# Patient Record
Sex: Male | Born: 1965 | Race: White | Hispanic: No | Marital: Married | Smoking: Never smoker
Health system: Southern US, Community
[De-identification: ages and names within clinical notes are randomized; demographics above are authoritative.]

## PROBLEM LIST (undated history)

## (undated) DIAGNOSIS — I10 Essential (primary) hypertension: Secondary | ICD-10-CM

## (undated) DIAGNOSIS — E039 Hypothyroidism, unspecified: Secondary | ICD-10-CM

## (undated) DIAGNOSIS — J45909 Unspecified asthma, uncomplicated: Secondary | ICD-10-CM

## (undated) HISTORY — PX: CERVICAL DISC SURGERY: SHX588

## (undated) HISTORY — PX: COLONOSCOPY WITH PROPOFOL: SHX5780

---

## 2008-06-24 ENCOUNTER — Ambulatory Visit: Payer: Self-pay | Admitting: Unknown Physician Specialty

## 2012-09-22 DIAGNOSIS — M47812 Spondylosis without myelopathy or radiculopathy, cervical region: Secondary | ICD-10-CM | POA: Insufficient documentation

## 2012-09-22 DIAGNOSIS — M25519 Pain in unspecified shoulder: Secondary | ICD-10-CM | POA: Insufficient documentation

## 2014-01-25 DIAGNOSIS — R7989 Other specified abnormal findings of blood chemistry: Secondary | ICD-10-CM | POA: Insufficient documentation

## 2015-02-17 DIAGNOSIS — I10 Essential (primary) hypertension: Secondary | ICD-10-CM | POA: Insufficient documentation

## 2015-02-17 DIAGNOSIS — J302 Other seasonal allergic rhinitis: Secondary | ICD-10-CM | POA: Insufficient documentation

## 2015-10-05 ENCOUNTER — Ambulatory Visit (INDEPENDENT_AMBULATORY_CARE_PROVIDER_SITE_OTHER): Payer: Federal, State, Local not specified - PPO | Admitting: Urology

## 2015-10-05 ENCOUNTER — Encounter: Payer: Self-pay | Admitting: Urology

## 2015-10-05 VITALS — BP 109/71 | HR 75 | Ht 71.0 in | Wt 219.0 lb

## 2015-10-05 DIAGNOSIS — M549 Dorsalgia, unspecified: Secondary | ICD-10-CM

## 2015-10-05 DIAGNOSIS — N5201 Erectile dysfunction due to arterial insufficiency: Secondary | ICD-10-CM

## 2015-10-05 NOTE — Progress Notes (Signed)
10/05/2015 2:21 PM   Duane Soto Apr 02, 1965 [id number]  Referring provider: No referring provider defined for this encounter.  Chief Complaint  Patient presents with  . New Patient (Initial Visit)    ED    HPI: For the last month the patient has trouble sustaining an erection. He loses it before orgasm. Cialis helps minimally tender none. He's had some vague pressure in his lower back and left groin. He has no precipitating events. He's been on testosterone injection every 2 weeks for 3-4 years.  He has a multiple stone former and recently passed 3 stones. He has hypothyroidism.  He voids with a good flow and sometimes gets up once at night. He's not had previous GU or pelvic surgery as a risk factor for rectal dysfunction.    Modifying factors: There are no other modifying factors  Associated signs and symptoms: There are no other associated signs and symptoms Aggravating and relieving factors: There are no other aggravating or relieving factors Severity: Moderate Duration: Persistent   PMH: No past medical history on file.  Surgical History: No past surgical history on file.  Home Medications:    Medication List       Accurate as of 10/05/15  2:21 PM. Always use your most recent med list.          levothyroxine 50 MCG tablet Commonly known as:  SYNTHROID, LEVOTHROID Take 50 mcg by mouth daily before breakfast.   loratadine 10 MG tablet Commonly known as:  CLARITIN Take 10 mg by mouth daily.   losartan-hydrochlorothiazide 100-25 MG tablet Commonly known as:  HYZAAR   Testosterone Cypionate 200 MG/ML Kit Inject into the muscle.       Allergies: No Known Allergies  Family History: No family history on file.  Social History:  reports that he has never smoked. He has never used smokeless tobacco. He reports that he drinks alcohol. He reports that he does not use drugs.  ROS:                                        Physical  Exam: BP 109/71   Pulse 75   Ht _0  (1.803 m)   Wt 219 lb (99.3 kg)   BMI 30.54 kg/m   Constitutional:  Alert and oriented, No acute distress. HEENT: [place] AT, moist mucus membranes.  Trachea midline, no masses. Cardiovascular: No clubbing, cyanosis, or edema. Respiratory: Normal respiratory effort, no increased work of breathing. GI: Abdomen is soft, nontender, nondistended, no abdominal masses GU: No CVA tenderness. 40 g benign prostate Skin: No rashes, bruises or suspicious lesions. Lymph: No cervical or inguinal adenopathy. Neurologic: Grossly intact, no focal deficits, moving all 4 extremities. Psychiatric: Normal mood and affect.  Laboratory Data: No results found for: WBC, HGB, HCT, MCV, PLT  No results found for: CREATININE  No results found for: PSA  No results found for: TESTOSTERONE  No results found for: HGBA1C  Urinalysis No results found for: COLORURINE, APPEARANCEUR, LABSPEC, PHURINE, GLUCOSEU, HGBUR, BILIRUBINUR, KETONESUR, PROTEINUR, UROBILINOGEN, NITRITE, LEUKOCYTESUR  Pertinent Imaging: None  Assessment & Plan:  The patient has erectile dysfunction. He has a risk factor with hypogonadism treated with testosterone. Pathophysiology discussed. Role of other medication discussed. I talked about other options but certainly he should not have a penile implant this stage. He still able to have good days and bad days with his erections. His  testosterone level is over 800. I gave him some Viagra samples. He will call if he needs a prescription and generic medicine can always be prescribed. He will make another appointment with another provider. The office if he is searching for another treatment but he did not seem very interested in injection therapy  There are no diagnoses linked to this encounter.  No Follow-up on file.  Reece Packer, MD  [facility] [address], [place], [place] [address] [phone]

## 2015-10-05 NOTE — Addendum Note (Signed)
Addended by: Lonna Cobb on: 10/05/2015 04:37 PM   Modules accepted: Orders

## 2015-10-07 LAB — CULTURE, URINE COMPREHENSIVE

## 2017-02-12 ENCOUNTER — Encounter: Payer: Self-pay | Admitting: Emergency Medicine

## 2017-02-12 ENCOUNTER — Emergency Department: Payer: Worker's Compensation

## 2017-02-12 ENCOUNTER — Other Ambulatory Visit: Payer: Self-pay

## 2017-02-12 ENCOUNTER — Emergency Department
Admission: EM | Admit: 2017-02-12 | Discharge: 2017-02-12 | Disposition: A | Discharge status: Home / Self Care | Payer: Worker's Compensation | Attending: Emergency Medicine | Admitting: Emergency Medicine

## 2017-02-12 DIAGNOSIS — Y929 Unspecified place or not applicable: Secondary | ICD-10-CM | POA: Diagnosis not present

## 2017-02-12 DIAGNOSIS — Y939 Activity, unspecified: Secondary | ICD-10-CM | POA: Insufficient documentation

## 2017-02-12 DIAGNOSIS — S82891A Other fracture of right lower leg, initial encounter for closed fracture: Secondary | ICD-10-CM

## 2017-02-12 DIAGNOSIS — Z79899 Other long term (current) drug therapy: Secondary | ICD-10-CM | POA: Diagnosis not present

## 2017-02-12 DIAGNOSIS — S82831A Other fracture of upper and lower end of right fibula, initial encounter for closed fracture: Secondary | ICD-10-CM | POA: Diagnosis not present

## 2017-02-12 DIAGNOSIS — Y990 Civilian activity done for income or pay: Secondary | ICD-10-CM | POA: Insufficient documentation

## 2017-02-12 DIAGNOSIS — S93431A Sprain of tibiofibular ligament of right ankle, initial encounter: Secondary | ICD-10-CM | POA: Diagnosis not present

## 2017-02-12 DIAGNOSIS — S8991XA Unspecified injury of right lower leg, initial encounter: Secondary | ICD-10-CM | POA: Diagnosis present

## 2017-02-12 DIAGNOSIS — W000XXA Fall on same level due to ice and snow, initial encounter: Secondary | ICD-10-CM | POA: Insufficient documentation

## 2017-02-12 DIAGNOSIS — S82401A Unspecified fracture of shaft of right fibula, initial encounter for closed fracture: Secondary | ICD-10-CM | POA: Diagnosis not present

## 2017-02-12 DIAGNOSIS — I10 Essential (primary) hypertension: Secondary | ICD-10-CM | POA: Insufficient documentation

## 2017-02-12 HISTORY — DX: Essential (primary) hypertension: I10

## 2017-02-12 MED ORDER — SODIUM CHLORIDE 0.9 % IV BOLUS (SEPSIS)
1000.0000 mL | Freq: Once | INTRAVENOUS | Status: AC
  Administered 2017-02-12: 1000 mL via INTRAVENOUS

## 2017-02-12 MED ORDER — OXYCODONE HCL 5 MG PO TABS
5.0000 mg | ORAL_TABLET | Freq: Once | ORAL | Status: AC
  Administered 2017-02-12: 5 mg via ORAL
  Filled 2017-02-12: qty 1

## 2017-02-12 MED ORDER — FENTANYL CITRATE (PF) 100 MCG/2ML IJ SOLN
50.0000 ug | INTRAMUSCULAR | Status: AC | PRN
  Administered 2017-02-12 (×2): 50 ug via INTRAVENOUS
  Filled 2017-02-12 (×2): qty 2

## 2017-02-12 MED ORDER — IBUPROFEN 800 MG PO TABS
800.0000 mg | ORAL_TABLET | Freq: Once | ORAL | Status: AC
  Administered 2017-02-12: 800 mg via ORAL
  Filled 2017-02-12: qty 1

## 2017-02-12 MED ORDER — ONDANSETRON 4 MG PO TBDP: 4.0000 mg | ORAL_TABLET | Freq: Three times a day (TID) | ORAL | 0 refills | Status: AC | PRN

## 2017-02-12 MED ORDER — OXYCODONE-ACETAMINOPHEN 5-325 MG PO TABS: 1.0000 | ORAL_TABLET | ORAL | 0 refills | Status: AC | PRN

## 2017-02-12 NOTE — ED Notes (Signed)
See triage note  States pain is mainly to right ankle and moving up to right lower leg  Positive pulses

## 2017-02-12 NOTE — ED Provider Notes (Signed)
[facility] Emergency Department Provider Note ____________________________________________  Time seen: Approximately 9:19 AM  I have reviewed the triage vital signs and the nursing notes.   HISTORY  Chief Complaint Fall    HPI Duane Soto is a 51 y.o. male who presents to the emergency department for treatment and evaluation of right ankle pain and swelling after sustaining a mechanical, non-syncopal fall prior to arrival. 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 He states that he could not regain his balance and fell. He heard something crack in his right leg or ankle and was immediately unable to bear weight. He now has significant pain and swelling over the right ankle and pain in the lower leg.  Past Medical History:  Diagnosis Date  . Hypertension     Patient Active Problem List   Diagnosis Date Noted  . Allergic rhinitis, seasonal 02/17/2015  . Benign essential hypertension 02/17/2015  . Low testosterone 01/25/2014  . Cervical spondylosis 09/22/2012  . Shoulder joint pain 09/22/2012    History reviewed. No pertinent surgical history.  Prior to Admission medications   Medication Sig Start Date End Date Taking? Authorizing Provider  levothyroxine (SYNTHROID, LEVOTHROID) 50 MCG tablet Take 50 mcg by mouth daily before breakfast.    [provider]  loratadine (CLARITIN) 10 MG tablet Take 10 mg by mouth daily.    [provider]  losartan-hydrochlorothiazide Konrad Penta) 100-25 MG tablet  09/13/15   [provider]  ondansetron (ZOFRAN-ODT) 4 MG disintegrating tablet Take 1 tablet (4 mg total) by mouth every 8 (eight) hours as needed for nausea or vomiting. 02/12/17   Mireyah Chervenak B, FNP  oxyCODONE-acetaminophen (ROXICET) 5-325 MG tablet Take 1-2 tablets by mouth every 4 (four) hours as needed for severe pain. 02/12/17   Faryn Sieg, Johnette Abraham B, FNP  Testosterone Cypionate 200 MG/ML KIT Inject  into the muscle.    [provider]    Allergies Patient has no known allergies.  No family history on file.  Social History Social History   Tobacco Use  . Smoking status: Never Smoker  . Smokeless tobacco: Never Used  Substance Use Topics  . Alcohol use: Yes  . Drug use: No    Review of Systems Constitutional: Negative for recent illness Cardiovascular: Negative for chest pain Respiratory: Negative for shortness of breath Musculoskeletal: Positive for right lower extremity pain. Skin: Negative for open wounds Neurological: Negative for loss of consciousness  ____________________________________________   PHYSICAL EXAM:  VITAL SIGNS: ED Triage Vitals  Enc Vitals Group     BP 02/12/17 0907 119/71     Pulse Rate 02/12/17 0907 71     Resp 02/12/17 0907 18     Temp 02/12/17 0907 97.9 F (36.6 C)     Temp Source 02/12/17 0907 Oral     SpO2 02/12/17 0907 96 %     Weight 02/12/17 0902 225 lb (102.1 kg)     Height 02/12/17 0902 5' 11"  (1.803 m)     Head Circumference --      Peak Flow --      Pain Score --      Pain Loc --      Pain Edu? --      Excl. in [place]? --     Constitutional: Alert and oriented. Well appearing and in no acute distress. Eyes: Conjunctivae are clear without discharge or drainage Head: Atraumatic Neck: Supple, Nexus criteria negative. Respiratory: Respirations even and unlabored.  Musculoskeletal: Focal tenderness over the lower right fibula and diffuse tenderness with deformity of the ankle.  No tenderness elicited with palpation over the foot.  Full range of motion of the toes.  Full range of motion at the knee.  No bony tenderness over the proximal tibia or fibula.  No focal midline tenderness over the length of the spine.  Neurologic: Awake, alert, oriented x4. Skin: Intact Psychiatric: Affect and behavior are appropriate.  ____________________________________________   LABS (all labs ordered are listed, but only abnormal  results are displayed)  Labs Reviewed - No data to display ____________________________________________  RADIOLOGY  Right ankle image shows a comminuted, distracted fracture of the junction of the middle and distal thirds of the right fibula.  Also, a mildly displaced posterior malleolar fracture with disruption of the ankle joint mortise. I, Sherrie George, personally viewed and evaluated these images (plain radiographs) as part of my medical decision making, as well as reviewing the written report by the radiologist.  CT of the right ankle: IMPRESSION:  1. Comminuted fracture of the distal fibular diaphysis with minimal  displacement.  2. Comminuted posterior tibial malleolar fracture with 5 mm of  distraction and involvement of the articular surface.  3. Widening of the distal tibiofibular joint consistent with  syndesmotic injury.  ___________________________________________   PROCEDURES  .Splint Application Date/Time: 03/49/1791 1:08 PM Performed by: Victorino Dike, FNP Authorized by: Victorino Dike, FNP   Consent:    Consent obtained:  Verbal Pre-procedure details:    Sensation:  Normal Procedure details:    Laterality:  Right   Cast type: long leg.   Splint type:  Long leg   Supplies:  Ortho-Glass Post-procedure details:    Pain:  Unchanged   Sensation:  Normal   Patient tolerance of procedure:  Tolerated well, no immediate complications  ____________________________________________   INITIAL IMPRESSION / ASSESSMENT AND PLAN / ED COURSE  Duane Soto is a 51 y.o. male who presents to the emergency department for treatment and evaluation after sustaining a mechanical, non-syncopal fall while at work.  Obvious deformity of the right ankle.  Awaiting x-ray results.  ----------------------------------------- 10:39 AM on 02/12/2017 ----------------------------------------- X-ray findings discussed with Dr. Rudene Christians who requests CT only of the ankle to further  evaluate the fractures.  Patient and family were updated and agreed to testing and treatment.  ----------------------------------------- 1:10 PM on 02/12/2017 ----------------------------------------- Supervisor who was in the room was asked if he would need any type of testing today for the workers [employer].  She stated that no drug screen or alcohol testing is required.  Patient discharged home after CT was complete.  Appointment for tomorrow at 130 was scheduled for the patient and he is aware and agreeable to the date and time.  He verbalizes understanding that he will need to go to the office tomorrow.  Home care was discussed.  He was given return precautions for the emergency department.  He will be given a prescription for Roxicet.    Medications  ibuprofen (ADVIL,MOTRIN) tablet 800 mg (800 mg Oral Given 02/12/17 0916)  fentaNYL (SUBLIMAZE) injection 50 mcg (50 mcg Intravenous Given 02/12/17 1154)  sodium chloride 0.9 % bolus 1,000 mL (0 mLs Intravenous Stopped 02/12/17 1258)  oxyCODONE (Oxy IR/ROXICODONE) immediate release tablet 5 mg (5 mg Oral Given 02/12/17 1243)    Pertinent labs & imaging results that were available during my care of the patient were reviewed by me and considered in my medical decision making (see chart  for details).  _________________________________________   FINAL CLINICAL IMPRESSION(S) / ED DIAGNOSES  Final diagnoses:  Closed fracture of distal end of right fibula, unspecified fracture morphology, initial encounter  Closed fracture of shaft of right fibula, unspecified fracture morphology, initial encounter  Syndesmotic disruption of ankle, right, initial encounter  Fracture of malleolus of right ankle, closed, initial encounter    ED Discharge Orders        Ordered    oxyCODONE-acetaminophen (ROXICET) 5-325 MG tablet  Every 4 hours PRN     02/12/17 1218    ondansetron (ZOFRAN-ODT) 4 MG disintegrating tablet  Every 8 hours PRN     02/12/17  1218       If controlled substance prescribed during this visit, 12 month history viewed on the [place] prior to issuing an initial prescription for Schedule II or III opiod.    Victorino Dike, FNP 02/12/17 1313    Lisa Roca, MD 02/12/17 [phone]

## 2017-02-12 NOTE — Discharge Instructions (Signed)
Please go to Dr. Neomia GlassMenz's office tomorrow at 1:30.  Come back to the ER today if your pain becomes uncontrolled with the pain medication or for other symptoms of concern.

## 2017-02-12 NOTE — ED Triage Notes (Signed)
Brought in via ems s/p fall  Injury to right ankle  Positive swelling   Questionable deformity

## 2017-02-14 ENCOUNTER — Ambulatory Visit

## 2017-02-14 ENCOUNTER — Encounter: Payer: Self-pay | Admitting: *Deleted

## 2017-02-14 ENCOUNTER — Ambulatory Visit: Admitting: Anesthesiology

## 2017-02-14 ENCOUNTER — Other Ambulatory Visit: Payer: Self-pay

## 2017-02-14 ENCOUNTER — Encounter
Admission: RE | Disposition: A | Discharge status: Home / Self Care | Payer: Self-pay | Source: Ambulatory Visit | Attending: Orthopedic Surgery

## 2017-02-14 ENCOUNTER — Ambulatory Visit
Admission: RE | Admit: 2017-02-14 | Discharge: 2017-02-14 | Disposition: A | Discharge status: Home / Self Care | Source: Ambulatory Visit | Attending: Orthopedic Surgery | Admitting: Orthopedic Surgery

## 2017-02-14 DIAGNOSIS — Z79899 Other long term (current) drug therapy: Secondary | ICD-10-CM | POA: Diagnosis not present

## 2017-02-14 DIAGNOSIS — E291 Testicular hypofunction: Secondary | ICD-10-CM | POA: Insufficient documentation

## 2017-02-14 DIAGNOSIS — I10 Essential (primary) hypertension: Secondary | ICD-10-CM | POA: Diagnosis not present

## 2017-02-14 DIAGNOSIS — X58XXXA Exposure to other specified factors, initial encounter: Secondary | ICD-10-CM | POA: Insufficient documentation

## 2017-02-14 DIAGNOSIS — Z419 Encounter for procedure for purposes other than remedying health state, unspecified: Secondary | ICD-10-CM

## 2017-02-14 DIAGNOSIS — S82454A Nondisplaced comminuted fracture of shaft of right fibula, initial encounter for closed fracture: Secondary | ICD-10-CM | POA: Insufficient documentation

## 2017-02-14 HISTORY — PX: ORIF ANKLE FRACTURE: SHX5408

## 2017-02-14 SURGERY — OPEN REDUCTION INTERNAL FIXATION (ORIF) ANKLE FRACTURE
Anesthesia: General | Site: Ankle | Laterality: Right | Wound class: Clean

## 2017-02-14 MED ORDER — FENTANYL CITRATE (PF) 100 MCG/2ML IJ SOLN
INTRAMUSCULAR | Status: AC
  Filled 2017-02-14: qty 2

## 2017-02-14 MED ORDER — SUGAMMADEX SODIUM 200 MG/2ML IV SOLN
INTRAVENOUS | Status: DC | PRN
  Administered 2017-02-14: 204.2 mg via INTRAVENOUS

## 2017-02-14 MED ORDER — OXYCODONE HCL 5 MG/5ML PO SOLN: 5.0000 mg | Freq: Once | ORAL | Status: AC | PRN

## 2017-02-14 MED ORDER — DEXAMETHASONE SODIUM PHOSPHATE 10 MG/ML IJ SOLN
INTRAMUSCULAR | Status: DC | PRN
  Administered 2017-02-14: 10 mg via INTRAVENOUS

## 2017-02-14 MED ORDER — PROPOFOL 10 MG/ML IV BOLUS
INTRAVENOUS | Status: AC
  Filled 2017-02-14: qty 20

## 2017-02-14 MED ORDER — SUCCINYLCHOLINE CHLORIDE 20 MG/ML IJ SOLN
INTRAMUSCULAR | Status: AC
  Filled 2017-02-14: qty 1

## 2017-02-14 MED ORDER — ROCURONIUM BROMIDE 50 MG/5ML IV SOLN
INTRAVENOUS | Status: AC
Start: 2017-02-14 — End: ?
  Filled 2017-02-14: qty 1

## 2017-02-14 MED ORDER — OXYCODONE-ACETAMINOPHEN 5-325 MG PO TABS: 1.0000 | ORAL_TABLET | Freq: Four times a day (QID) | ORAL | 0 refills | Status: AC | PRN

## 2017-02-14 MED ORDER — LIDOCAINE HCL (CARDIAC) 20 MG/ML IV SOLN
INTRAVENOUS | Status: DC | PRN
  Administered 2017-02-14: 100 mg via INTRAVENOUS

## 2017-02-14 MED ORDER — PROPOFOL 10 MG/ML IV BOLUS
INTRAVENOUS | Status: DC | PRN
  Administered 2017-02-14: 200 mg via INTRAVENOUS

## 2017-02-14 MED ORDER — LIDOCAINE HCL (PF) 2 % IJ SOLN
INTRAMUSCULAR | Status: AC
  Filled 2017-02-14: qty 10

## 2017-02-14 MED ORDER — LACTATED RINGERS IV SOLN
INTRAVENOUS | Status: DC
  Administered 2017-02-14 (×2): via INTRAVENOUS

## 2017-02-14 MED ORDER — FAMOTIDINE 20 MG PO TABS
ORAL_TABLET | ORAL | Status: AC
  Administered 2017-02-14: 20 mg via ORAL
  Filled 2017-02-14: qty 1

## 2017-02-14 MED ORDER — ROCURONIUM BROMIDE 100 MG/10ML IV SOLN
INTRAVENOUS | Status: DC | PRN
  Administered 2017-02-14 (×2): 10 mg via INTRAVENOUS
  Administered 2017-02-14: 40 mg via INTRAVENOUS

## 2017-02-14 MED ORDER — FENTANYL CITRATE (PF) 100 MCG/2ML IJ SOLN
25.0000 ug | INTRAMUSCULAR | Status: AC | PRN
  Administered 2017-02-14 (×2): 25 ug via INTRAVENOUS

## 2017-02-14 MED ORDER — MIDAZOLAM HCL 2 MG/2ML IJ SOLN
INTRAMUSCULAR | Status: DC | PRN
Start: 2017-02-14 — End: 2017-02-14
  Administered 2017-02-14: 2 mg via INTRAVENOUS

## 2017-02-14 MED ORDER — DEXAMETHASONE SODIUM PHOSPHATE 10 MG/ML IJ SOLN
INTRAMUSCULAR | Status: AC
  Filled 2017-02-14: qty 1

## 2017-02-14 MED ORDER — KETOROLAC TROMETHAMINE 30 MG/ML IJ SOLN
INTRAMUSCULAR | Status: AC
  Filled 2017-02-14: qty 1

## 2017-02-14 MED ORDER — FENTANYL CITRATE (PF) 100 MCG/2ML IJ SOLN
INTRAMUSCULAR | Status: AC
  Administered 2017-02-14: 25 ug via INTRAVENOUS
  Filled 2017-02-14: qty 2

## 2017-02-14 MED ORDER — MIDAZOLAM HCL 2 MG/2ML IJ SOLN
INTRAMUSCULAR | Status: AC
Start: 2017-02-14 — End: ?
  Filled 2017-02-14: qty 2

## 2017-02-14 MED ORDER — FENTANYL CITRATE (PF) 100 MCG/2ML IJ SOLN
25.0000 ug | INTRAMUSCULAR | Status: AC | PRN
  Administered 2017-02-14 (×6): 25 ug via INTRAVENOUS

## 2017-02-14 MED ORDER — ONDANSETRON HCL 4 MG/2ML IJ SOLN
INTRAMUSCULAR | Status: AC
  Filled 2017-02-14: qty 2

## 2017-02-14 MED ORDER — OXYCODONE HCL 5 MG PO TABS
5.0000 mg | ORAL_TABLET | Freq: Once | ORAL | Status: AC | PRN
  Administered 2017-02-14: 5 mg via ORAL

## 2017-02-14 MED ORDER — FENTANYL CITRATE (PF) 100 MCG/2ML IJ SOLN
INTRAMUSCULAR | Status: DC | PRN
  Administered 2017-02-14 (×2): 50 ug via INTRAVENOUS
  Administered 2017-02-14: 100 ug via INTRAVENOUS

## 2017-02-14 MED ORDER — CEFAZOLIN SODIUM-DEXTROSE 2-4 GM/100ML-% IV SOLN
2.0000 g | Freq: Once | INTRAVENOUS | Status: AC
  Administered 2017-02-14: 2 g via INTRAVENOUS

## 2017-02-14 MED ORDER — KETOROLAC TROMETHAMINE 30 MG/ML IJ SOLN
INTRAMUSCULAR | Status: DC | PRN
  Administered 2017-02-14: 30 mg via INTRAVENOUS

## 2017-02-14 MED ORDER — SUGAMMADEX SODIUM 200 MG/2ML IV SOLN
INTRAVENOUS | Status: AC
Start: 2017-02-14 — End: ?
  Filled 2017-02-14: qty 2

## 2017-02-14 MED ORDER — CEFAZOLIN SODIUM-DEXTROSE 2-4 GM/100ML-% IV SOLN
INTRAVENOUS | Status: AC
  Filled 2017-02-14: qty 100

## 2017-02-14 MED ORDER — ACETAMINOPHEN 10 MG/ML IV SOLN
INTRAVENOUS | Status: DC | PRN
  Administered 2017-02-14: 1000 mg via INTRAVENOUS

## 2017-02-14 MED ORDER — ACETAMINOPHEN 10 MG/ML IV SOLN
INTRAVENOUS | Status: AC
Start: 2017-02-14 — End: ?
  Filled 2017-02-14: qty 100

## 2017-02-14 MED ORDER — FAMOTIDINE 20 MG PO TABS
20.0000 mg | ORAL_TABLET | Freq: Once | ORAL | Status: AC
  Administered 2017-02-14: 20 mg via ORAL

## 2017-02-14 MED ORDER — SUCCINYLCHOLINE CHLORIDE 20 MG/ML IJ SOLN
INTRAMUSCULAR | Status: DC | PRN
  Administered 2017-02-14: 120 mg via INTRAVENOUS

## 2017-02-14 MED ORDER — ONDANSETRON HCL 4 MG/2ML IJ SOLN
INTRAMUSCULAR | Status: DC | PRN
  Administered 2017-02-14: 4 mg via INTRAVENOUS

## 2017-02-14 MED ORDER — OXYCODONE HCL 5 MG PO TABS
ORAL_TABLET | ORAL | Status: AC
  Administered 2017-02-14: 5 mg via ORAL
  Filled 2017-02-14: qty 1

## 2017-02-14 MED ORDER — NEOMYCIN-POLYMYXIN B GU 40-200000 IR SOLN
Status: DC | PRN
  Administered 2017-02-14: 4 mL

## 2017-02-14 SURGICAL SUPPLY — 46 items
BANDAGE ACE 4X5 VEL STRL LF (GAUZE/BANDAGES/DRESSINGS) ×6 IMPLANT
CANISTER SUCT 1200ML W/VALVE (MISCELLANEOUS) ×3 IMPLANT
CHLORAPREP W/TINT 26ML (MISCELLANEOUS) ×3 IMPLANT
CUFF TOURN 24 STER (MISCELLANEOUS) IMPLANT
CUFF TOURN 30 STER DUAL PORT (MISCELLANEOUS) IMPLANT
DRAPE FLUOR MINI C-ARM 54X84 (DRAPES) ×3 IMPLANT
DRAPE INCISE IOBAN 66X45 STRL (DRAPES) ×3 IMPLANT
DRAPE U-SHAPE 47X51 STRL (DRAPES) ×3 IMPLANT
DRSG EMULSION OIL 3X8 NADH (GAUZE/BANDAGES/DRESSINGS) ×3 IMPLANT
ELECT CAUTERY BLADE 6.4 (BLADE) ×3 IMPLANT
ELECT REM PT RETURN 9FT ADLT (ELECTROSURGICAL) ×3
ELECTRODE REM PT RTRN 9FT ADLT (ELECTROSURGICAL) ×1 IMPLANT
GAUZE PETRO XEROFOAM 1X8 (MISCELLANEOUS) ×3 IMPLANT
GAUZE SPONGE 4X4 12PLY STRL (GAUZE/BANDAGES/DRESSINGS) ×3 IMPLANT
GLOVE SURG SYN 9.0  PF PI (GLOVE) ×2
GLOVE SURG SYN 9.0 PF PI (GLOVE) ×1 IMPLANT
GOWN SRG 2XL LVL 4 RGLN SLV (GOWNS) ×1 IMPLANT
GOWN STRL NON-REIN 2XL LVL4 (GOWNS) ×2
GOWN STRL REUS W/ TWL LRG LVL3 (GOWN DISPOSABLE) ×1 IMPLANT
GOWN STRL REUS W/TWL LRG LVL3 (GOWN DISPOSABLE) ×2
HEMOVAC 400ML (MISCELLANEOUS)
KIT DRAIN HEMOVAC JP 7FR 400ML (MISCELLANEOUS) IMPLANT
KIT RM TURNOVER STRD PROC AR (KITS) ×3 IMPLANT
LABEL OR SOLS (LABEL) ×3 IMPLANT
NS IRRIG 1000ML POUR BTL (IV SOLUTION) ×3 IMPLANT
PACK EXTREMITY ARMC (MISCELLANEOUS) ×3 IMPLANT
PAD ABD DERMACEA PRESS 5X9 (GAUZE/BANDAGES/DRESSINGS) ×6 IMPLANT
PAD CAST CTTN 4X4 STRL (SOFTGOODS) ×2 IMPLANT
PAD PREP 24X41 OB/GYN DISP (PERSONAL CARE ITEMS) ×3 IMPLANT
PADDING CAST COTTON 4X4 STRL (SOFTGOODS) ×4
PLATE LCP 3.5 1/3 TUB 5HX57 (Plate) ×3 IMPLANT
REPAIR TROPE KNTLS SS SYNDESMO (Orthopedic Implant) ×3 IMPLANT
SCREW CORTICAL 3.5MM  55MM (Screw) ×2 IMPLANT
SCREW CORTICAL 3.5MM 55MM (Screw) ×1 IMPLANT
SPLINT CAST 1 STEP 5X30 WHT (MISCELLANEOUS) ×3 IMPLANT
SPONGE LAP 18X18 5 PK (GAUZE/BANDAGES/DRESSINGS) ×3 IMPLANT
STAPLER SKIN PROX 35W (STAPLE) ×3 IMPLANT
SUT ETHILON 3-0 FS-10 30 BLK (SUTURE) ×3
SUT MNCRL AB 4-0 PS2 18 (SUTURE) ×6 IMPLANT
SUT VIC AB 0 CT1 36 (SUTURE) ×3 IMPLANT
SUT VIC AB 2-0 SH 27 (SUTURE) ×4
SUT VIC AB 2-0 SH 27XBRD (SUTURE) ×2 IMPLANT
SUT VIC AB 3-0 SH 27 (SUTURE) ×2
SUT VIC AB 3-0 SH 27X BRD (SUTURE) ×1 IMPLANT
SUTURE EHLN 3-0 FS-10 30 BLK (SUTURE) ×1 IMPLANT
SYR 10ML LL (SYRINGE) ×3 IMPLANT

## 2017-02-14 NOTE — Anesthesia Procedure Notes (Signed)
Procedure Name: Intubation Date/Time: 02/14/2017 2:54 PM Performed by: Nelda Marseille, CRNA Pre-anesthesia Checklist: Patient identified, Patient being monitored, Timeout performed, Emergency Drugs available and Suction available Patient Re-evaluated:Patient Re-evaluated prior to induction Oxygen Delivery Method: Circle system utilized Preoxygenation: Pre-oxygenation with 100% oxygen Induction Type: IV induction Ventilation: Mask ventilation without difficulty Laryngoscope Size: Mac and 3 Grade View: Grade I Tube type: Oral Tube size: 7.5 mm Number of attempts: 1 Airway Equipment and Method: Stylet Placement Confirmation: ETT inserted through vocal cords under direct vision,  positive ETCO2 and breath sounds checked- equal and bilateral Secured at: 21 cm Tube secured with: Tape Dental Injury: Teeth and Oropharynx as per pre-operative assessment

## 2017-02-14 NOTE — Anesthesia Post-op Follow-up Note (Signed)
Anesthesia QCDR form completed.        

## 2017-02-14 NOTE — Anesthesia Preprocedure Evaluation (Signed)
Anesthesia Evaluation  Patient identified by MRN, date of birth, ID band Patient awake    Reviewed: Allergy & Precautions, H&P , NPO status , Patient's Chart, lab work & pertinent test results  History of Anesthesia Complications Negative for: history of anesthetic complications  Airway Mallampati: III  TM Distance: >3 FB Neck ROM: full    Dental  (+) Chipped, Poor Dentition, Caps   Pulmonary neg pulmonary ROS, neg shortness of breath,           Cardiovascular Exercise Tolerance: Good hypertension, (-) angina(-) Past MI and (-) DOE      Neuro/Psych negative neurological ROS  negative psych ROS   GI/Hepatic negative GI ROS, Neg liver ROS, neg GERD  ,  Endo/Other  negative endocrine ROS  Renal/GU      Musculoskeletal   Abdominal   Peds  Hematology negative hematology ROS (+)   Anesthesia Other Findings Signs and symptoms suggestive of sleep apnea   Past Medical History: No date: Hypertension  Past Surgical History: No date: CERVICAL DISC SURGERY  BMI    Body Mass Index:  31.38 kg/m      Reproductive/Obstetrics negative OB ROS                             Anesthesia Physical Anesthesia Plan  ASA: III  Anesthesia Plan: General ETT   Post-op Pain Management:    Induction: Intravenous  PONV Risk Score and Plan: Ondansetron, Dexamethasone and Midazolam  Airway Management Planned: Oral ETT  Additional Equipment:   Intra-op Plan:   Post-operative Plan: Extubation in OR  Informed Consent: I have reviewed the patients History and Physical, chart, labs and discussed the procedure including the risks, benefits and alternatives for the proposed anesthesia with the patient or authorized representative who has indicated his/her understanding and acceptance.   Dental Advisory Given  Plan Discussed with: Anesthesiologist, CRNA and Surgeon  Anesthesia Plan Comments: (Patient  consented for risks of anesthesia including but not limited to:  - adverse reactions to medications - damage to teeth, lips or other oral mucosa - sore throat or hoarseness - Damage to heart, brain, lungs or loss of life  Patient voiced understanding.)        Anesthesia Quick Evaluation

## 2017-02-14 NOTE — Transfer of Care (Signed)
Immediate Anesthesia Transfer of Care Note  Patient: Duane SeltzerDaniel J Soto  Procedure(s) Performed: OPEN REDUCTION INTERNAL FIXATION (ORIF) ANKLE FRACTURE (Right Ankle)  Patient Location: PACU  Anesthesia Type:General  Level of Consciousness: sedated  Airway & Oxygen Therapy: Patient Spontanous Breathing and Patient connected to face mask oxygen  Post-op Assessment: Report given to RN and Post -op Vital signs reviewed and stable  Post vital signs: Reviewed and stable  Last Vitals:  Vitals:   02/14/17 1316 02/14/17 1327  BP:    Pulse:  76  Resp:    Temp: 36.6 C   SpO2:  99%    Last Pain:  Vitals:   02/14/17 1315  TempSrc: Temporal  PainSc: 3          Complications: No apparent anesthesia complications

## 2017-02-14 NOTE — Discharge Instructions (Addendum)
2 by elevated as much as possible. Aspirin 325 mg daily. Pain medicine as directed. Keep ice on the ankle today and tomorrow. No weight on right leg.  AMBULATORY SURGERY  DISCHARGE INSTRUCTIONS   1) The drugs that you were given will stay in your system until tomorrow so for the next 24 hours you should not:  A) Drive an automobile B) Make any legal decisions C) Drink any alcoholic beverage   2) You may resume regular meals tomorrow.  Today it is better to start with liquids and gradually work up to solid foods.  You may eat anything you prefer, but it is better to start with liquids, then soup and crackers, and gradually work up to solid foods.   3) Please notify your doctor immediately if you have any unusual bleeding, trouble breathing, redness and pain at the surgery site, drainage, fever, or pain not relieved by medication.    4) Additional Instructions:   Please contact your physician with any problems or Same Day Surgery at [phone], Monday through Friday 6 am to 4 pm, or [place] at [facility] Main number at [phone].

## 2017-02-14 NOTE — H&P (Signed)
Reviewed paper H+P, will be scanned into chart. No changes noted.  

## 2017-02-14 NOTE — Op Note (Signed)
02/14/2017  3:44 PM  PATIENT:  Duane Soto  51 y.o. male  PRE-OPERATIVE DIAGNOSIS:  CLOSED NONDISPLACED COMMINUTED FRACTURE OF SHAFT RIGHT FIBULA  POST-OPERATIVE DIAGNOSIS:  CLOSED NONDISPLACED COMMINUTED FRACTURE OF SHAFT RIGHT FIBULA  PROCEDURE:  Procedure(s): OPEN REDUCTION INTERNAL FIXATION (ORIF) ANKLE FRACTURE (Right)  SURGEON: Leitha SchullerMichael J Evani Shrider, MD  ASSISTANTS: None  ANESTHESIA:   general  EBL:  Total I/O In: 600 [I.V.:600] Out: 10 [Blood:10]  BLOOD ADMINISTERED:none  DRAINS: none   LOCAL MEDICATIONS USED:  NONE  SPECIMEN:  No Specimen  DISPOSITION OF SPECIMEN:  N/A  COUNTS:  YES  TOURNIQUET:  * Missing tourniquet times found for documented tourniquets in log: [id number] *26 minutes at 300 mmHg  IMPLANTS: 5 hole third tibial plate with a 55 mm 3.5 screw and one stainless still tight rope  DICTATION: .Dragon Dictation patient brought the operating room and after adequate anesthesia was obtained the right leg was prepped and draped in sterile fashion with a bump underneath right buttocks to internally rotate the leg. After appropriate patient identification and timeout procedure were completed, tourniquet was raised. Incision was made over the distal fibula and with inversion of the ankle the ankle reduced well with no widening of the medial clear space. After exposing the distal fibula the 5 hole third tubular plate was applied and the from the second most distal hole a single tight rope was placed across the joint of the syndesmosis and hand tightened getting good reduction. Just proximal to this a drill hole was made through 4 cortices and a 55 mm 3.5 mm cortical screw from the cement set the set stainless steel was placed and this gave additional rigidity with stress views showing stable mortise. The tight rope was checked again to make sure was quite tight and under the fluoroscopy the medial side was up against the bone very nicely and appeared to acceptable hardware  fixation was obtained. The remaining 3 holes were left alone as the plate was acting as sensitive large washer on the lateral side. The wound was thoroughly irrigated and the wound closed with 3-0 Vicryl subcutaneous and skin staples Xeroform 4 x 4 web roll and stirrup splint applied followed by an Ace wrap  PLAN OF CARE: Discharge to home after PACU  PATIENT DISPOSITION:  PACU - hemodynamically stable.

## 2017-02-18 ENCOUNTER — Encounter: Payer: Self-pay | Admitting: Orthopedic Surgery

## 2017-02-18 NOTE — Anesthesia Postprocedure Evaluation (Signed)
Anesthesia Post Note  Patient: Duane SeltzerDaniel J Soto  Procedure(s) Performed: OPEN REDUCTION INTERNAL FIXATION (ORIF) ANKLE FRACTURE (Right Ankle)  Patient location during evaluation: PACU Anesthesia Type: General Level of consciousness: awake and alert Pain management: pain level controlled Vital Signs Assessment: post-procedure vital signs reviewed and stable Respiratory status: spontaneous breathing, nonlabored ventilation, respiratory function stable and patient connected to nasal cannula oxygen Cardiovascular status: blood pressure returned to baseline and stable Postop Assessment: no apparent nausea or vomiting Anesthetic complications: no     Last Vitals:  Vitals:   02/14/17 1719 02/14/17 1748  BP: (!) 149/78 (!) 154/93  Pulse: 78 74  Resp: 16 16  Temp:    SpO2: 97% 98%    Last Pain:  Vitals:   02/15/17 0844  TempSrc:   PainSc: 0-No pain                 Cleda MccreedyJoseph K Jenavee Laguardia

## 2017-03-13 ENCOUNTER — Encounter: Payer: Self-pay | Admitting: Orthopedic Surgery

## 2019-05-09 IMAGING — XA DG C-ARM 61-120 MIN
2 series · 2 of 2 positions shown · non-contrast
Comparison: None.

CLINICAL DATA: Fall.  Pain.

EXAM:
DG C-ARM 61-120 MIN; RIGHT ANKLE - 2 VIEW

[Series 1: ortho standard · 1 of 1 slices shown (1 of 2)]
[im 1/1]
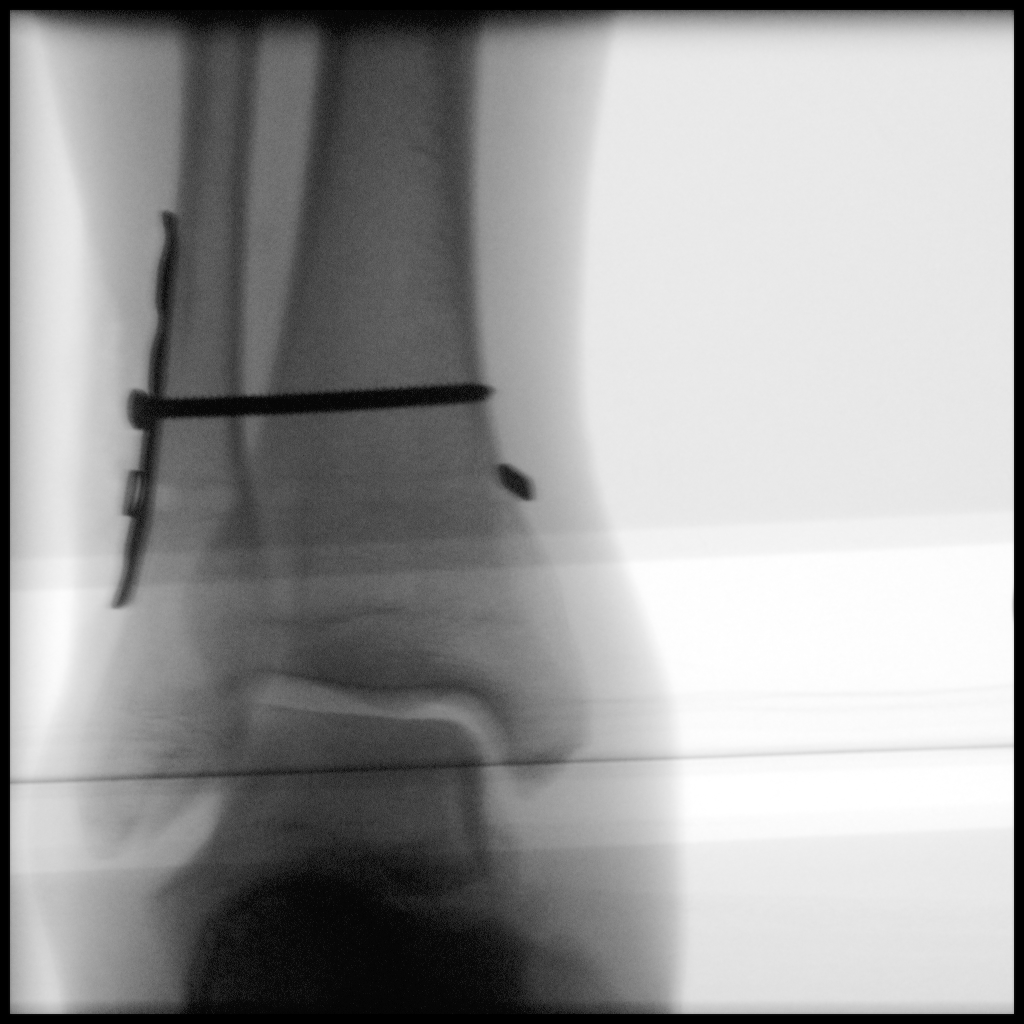

[Series 2: ortho standard · 1 of 1 slices shown (2 of 2)]
[im 1/1]
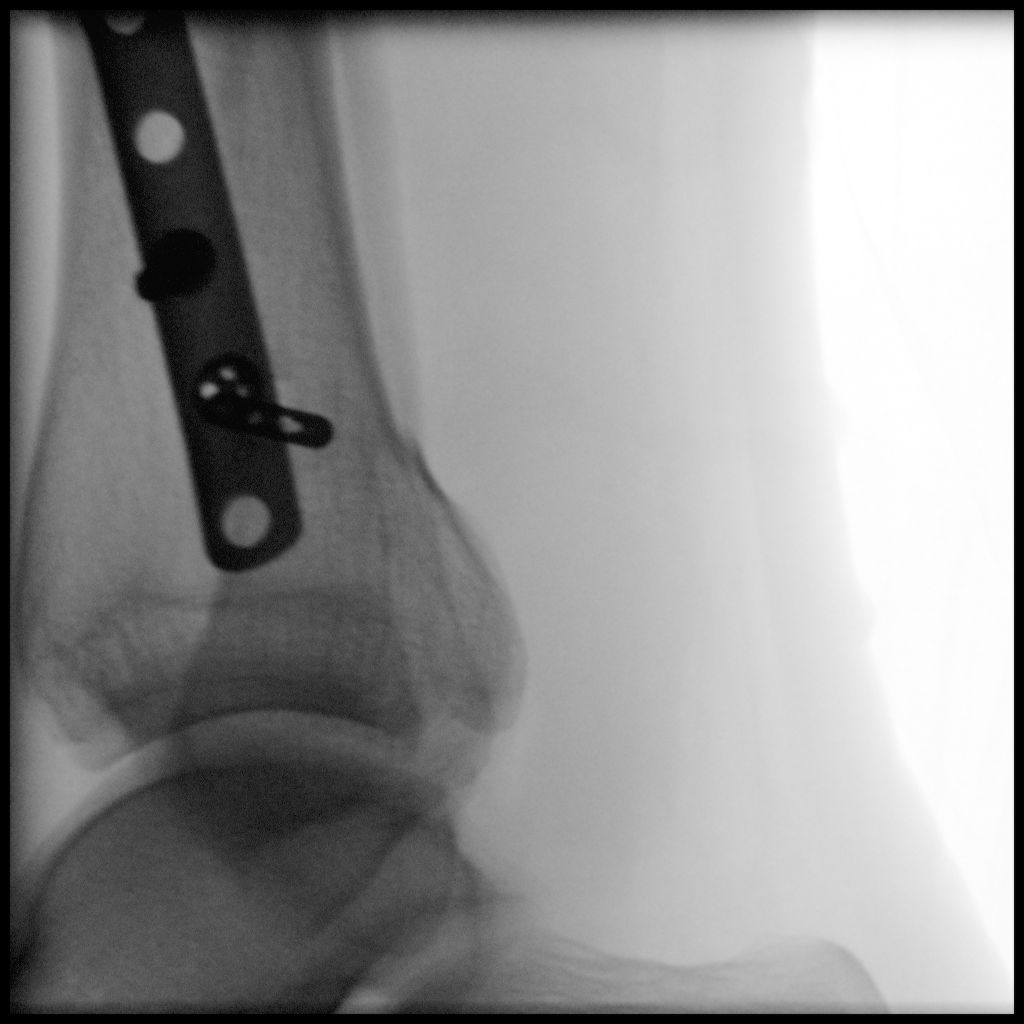

[2 of 2 positions shown; findings below may reference images not displayed]

FINDINGS: Open reduction internal fixation of distal tibial and fibular
injuries as documented with C-arm spot films. Plate and screw
fixation was employed.
IMPRESSION: Satisfactory position and alignment.

## 2020-02-11 ENCOUNTER — Other Ambulatory Visit
Admission: RE | Admit: 2020-02-11 | Discharge: 2020-02-11 | Disposition: A | Discharge status: Home / Self Care | Payer: Federal, State, Local not specified - PPO | Source: Ambulatory Visit | Attending: Gastroenterology | Admitting: Gastroenterology

## 2020-02-11 ENCOUNTER — Other Ambulatory Visit: Payer: Self-pay

## 2020-02-11 DIAGNOSIS — Z20822 Contact with and (suspected) exposure to covid-19: Secondary | ICD-10-CM | POA: Insufficient documentation

## 2020-02-11 DIAGNOSIS — Z01812 Encounter for preprocedural laboratory examination: Secondary | ICD-10-CM | POA: Insufficient documentation

## 2020-02-11 LAB — SARS CORONAVIRUS 2 (TAT 6-24 HRS): SARS Coronavirus 2: NEGATIVE

## 2020-02-15 ENCOUNTER — Ambulatory Visit
Admission: RE | Admit: 2020-02-15 | Discharge: 2020-02-15 | Disposition: A | Discharge status: Home / Self Care | Payer: Federal, State, Local not specified - PPO | Attending: Gastroenterology | Admitting: Gastroenterology

## 2020-02-15 ENCOUNTER — Encounter: Payer: Self-pay | Admitting: *Deleted

## 2020-02-15 ENCOUNTER — Other Ambulatory Visit: Payer: Self-pay

## 2020-02-15 ENCOUNTER — Ambulatory Visit: Payer: Federal, State, Local not specified - PPO | Admitting: Anesthesiology

## 2020-02-15 ENCOUNTER — Encounter
Admission: RE | Disposition: A | Discharge status: Home / Self Care | Payer: Self-pay | Source: Home / Self Care | Attending: Gastroenterology

## 2020-02-15 DIAGNOSIS — K5730 Diverticulosis of large intestine without perforation or abscess without bleeding: Secondary | ICD-10-CM | POA: Diagnosis not present

## 2020-02-15 DIAGNOSIS — Z1211 Encounter for screening for malignant neoplasm of colon: Secondary | ICD-10-CM | POA: Insufficient documentation

## 2020-02-15 DIAGNOSIS — Z79899 Other long term (current) drug therapy: Secondary | ICD-10-CM | POA: Diagnosis not present

## 2020-02-15 DIAGNOSIS — Z800 Family history of malignant neoplasm of digestive organs: Secondary | ICD-10-CM | POA: Insufficient documentation

## 2020-02-15 DIAGNOSIS — Z79890 Hormone replacement therapy (postmenopausal): Secondary | ICD-10-CM | POA: Insufficient documentation

## 2020-02-15 DIAGNOSIS — K640 First degree hemorrhoids: Secondary | ICD-10-CM | POA: Insufficient documentation

## 2020-02-15 HISTORY — PX: COLONOSCOPY WITH PROPOFOL: SHX5780

## 2020-02-15 HISTORY — DX: Unspecified asthma, uncomplicated: J45.909

## 2020-02-15 HISTORY — DX: Hypothyroidism, unspecified: E03.9

## 2020-02-15 SURGERY — COLONOSCOPY WITH PROPOFOL
Anesthesia: General

## 2020-02-15 MED ORDER — PROPOFOL 10 MG/ML IV BOLUS
INTRAVENOUS | Status: AC
  Filled 2020-02-15: qty 20

## 2020-02-15 MED ORDER — MIDAZOLAM HCL 2 MG/2ML IJ SOLN
INTRAMUSCULAR | Status: AC
  Filled 2020-02-15: qty 2

## 2020-02-15 MED ORDER — PROPOFOL 500 MG/50ML IV EMUL
INTRAVENOUS | Status: DC | PRN
  Administered 2020-02-15: 160 ug/kg/min via INTRAVENOUS
  Administered 2020-02-15: 180 ug/kg/min via INTRAVENOUS

## 2020-02-15 MED ORDER — PROPOFOL 500 MG/50ML IV EMUL
INTRAVENOUS | Status: AC
  Filled 2020-02-15: qty 50

## 2020-02-15 MED ORDER — SODIUM CHLORIDE 0.9 % IV SOLN: INTRAVENOUS | Status: DC

## 2020-02-15 MED ORDER — PROPOFOL 10 MG/ML IV BOLUS
INTRAVENOUS | Status: DC | PRN
  Administered 2020-02-15: 50 mg via INTRAVENOUS

## 2020-02-15 MED ORDER — LIDOCAINE HCL (PF) 2 % IJ SOLN
INTRAMUSCULAR | Status: AC
  Filled 2020-02-15: qty 5

## 2020-02-15 MED ORDER — MIDAZOLAM HCL 2 MG/2ML IJ SOLN
INTRAMUSCULAR | Status: DC | PRN
  Administered 2020-02-15: .5 mg via INTRAVENOUS

## 2020-02-15 NOTE — Interval H&P Note (Signed)
History and Physical Interval Note:  02/15/2020 9:45 AM  Duane Soto  has presented today for surgery, with the diagnosis of PH POLYPS.  The various methods of treatment have been discussed with the patient and family. After consideration of risks, benefits and other options for treatment, the patient has consented to  Procedure(s): COLONOSCOPY WITH PROPOFOL (N/A) as a surgical intervention.  The patient's history has been reviewed, patient examined, no change in status, stable for surgery.  I have reviewed the patient's chart and labs.  Questions were answered to the patient's satisfaction.     Regis Bill  Ok to proceed with colonoscopy

## 2020-02-15 NOTE — H&P (Signed)
Outpatient short stay form Pre-procedure 02/15/2020 9:43 AM Raylene Miyamoto MD, MPH  Primary Physician: Dr. Baldemar Lenis  Reason for visit:  Surveillance  History of present illness:   54 y/o gentleman with family history of colon cancer in his sister at age of 89. Last colonoscopy 5 years ago was benign. No blood thinners. No significant abdominal surgeries.    Current Facility-Administered Medications:  .  0.9 %  sodium chloride infusion, , Intravenous, Continuous, Tahiri Shareef, Hilton Cork, MD, Last Rate: 20 mL/hr at 02/15/20 0941, New Bag at 02/15/20 0941  Medications Prior to Admission  Medication Sig Dispense Refill Last Dose  . levothyroxine (SYNTHROID, LEVOTHROID) 75 MCG tablet Take 75 mcg by mouth daily before breakfast.   02/15/2020 at Unknown time  . losartan-hydrochlorothiazide (HYZAAR) 100-25 MG tablet Take 1 tablet by mouth daily.    02/15/2020 at Unknown time  . albuterol (PROVENTIL HFA;VENTOLIN HFA) 108 (90 Base) MCG/ACT inhaler Inhale 2 puffs into the lungs every 6 (six) hours as needed for wheezing or shortness of breath.     . doxycycline (VIBRAMYCIN) 100 MG capsule Take 100 mg by mouth 2 (two) times a week. May take 100 mg once daily for up to 7 days as needed for rosacea flares     . fluticasone (FLONASE) 50 MCG/ACT nasal spray Place 2 sprays into both nostrils daily as needed for allergies or rhinitis.     Marland Kitchen ibuprofen (ADVIL,MOTRIN) 200 MG tablet Take 800 mg by mouth every 8 (eight) hours as needed for headache or moderate pain.     Marland Kitchen loratadine (CLARITIN) 10 MG tablet Take 10 mg by mouth daily as needed for allergies.      Marland Kitchen ondansetron (ZOFRAN-ODT) 4 MG disintegrating tablet Take 1 tablet (4 mg total) by mouth every 8 (eight) hours as needed for nausea or vomiting. 20 tablet 0   . oxyCODONE-acetaminophen (ROXICET) 5-325 MG tablet Take 1-2 tablets by mouth every 4 (four) hours as needed for severe pain. 20 tablet 0   . Testosterone Cypionate 200 MG/ML KIT Inject 180 mg into the  muscle every 14 (fourteen) days.         No Known Allergies   Past Medical History:  Diagnosis Date  . Asthma   . Hypertension   . Hypothyroidism     Review of systems:  Otherwise negative.    Physical Exam  Gen: Alert, oriented. Appears stated age.  HEENT:PERRLA. Lungs: No respiratory distress CV: RRR Abd: soft, benign, no masses Ext: No edema    Planned procedures: Proceed with colonoscopy. The patient understands the nature of the planned procedure, indications, risks, alternatives and potential complications including but not limited to bleeding, infection, perforation, damage to internal organs and possible oversedation/side effects from anesthesia. The patient agrees and gives consent to proceed.  Please refer to procedure notes for findings, recommendations and patient disposition/instructions.     Raylene Miyamoto MD, MPH Gastroenterology 02/15/2020  9:43 AM

## 2020-02-15 NOTE — Op Note (Signed)
[facility] Gastroenterology Patient Name: Duane Soto Procedure Date: 02/15/2020 9:36 AM MRN: [id number] Account #: [id number] Date of Birth: 16-Aug-1965 Admit Type: Outpatient Age: 54 Room: [facility] ENDO ROOM 3 Gender: Male Note Status: Finalized Procedure:             Colonoscopy Indications:           High risk colon cancer surveillance: Personal history                         of colonic polyps, Family history of colon cancer in a                         first-degree relative before age 53 years Providers:             Andrey Farmer MD, MD Referring MD:          Caprice Renshaw MD (Referring MD) Medicines:             Monitored Anesthesia Care Complications:         No immediate complications. Procedure:             Pre-Anesthesia Assessment:                        - Prior to the procedure, a History and Physical was                         performed, and patient medications and allergies were                         reviewed. The patient is competent. The risks and                         benefits of the procedure and the sedation options and                         risks were discussed with the patient. All questions                         were answered and informed consent was obtained.                         Patient identification and proposed procedure were                         verified by the physician, the nurse, the anesthetist                         and the technician in the endoscopy suite. Mental                         Status Examination: alert and oriented. Airway                         Examination: normal oropharyngeal airway and neck                         mobility. Respiratory Examination: clear to  auscultation. CV Examination: normal. Prophylactic                         Antibiotics: The patient does not require prophylactic                         antibiotics. Prior Anticoagulants: The patient has                          taken no previous anticoagulant or antiplatelet                         agents. ASA Grade Assessment: II - A patient with mild                         systemic disease. After reviewing the risks and                         benefits, the patient was deemed in satisfactory                         condition to undergo the procedure. The anesthesia                         plan was to use monitored anesthesia care (MAC).                         Immediately prior to administration of medications,                         the patient was re-assessed for adequacy to receive                         sedatives. The heart rate, respiratory rate, oxygen                         saturations, blood pressure, adequacy of pulmonary                         ventilation, and response to care were monitored                         throughout the procedure. The physical status of the                         patient was re-assessed after the procedure.                        After obtaining informed consent, the colonoscope was                         passed under direct vision. Throughout the procedure,                         the patient's blood pressure, pulse, and oxygen                         saturations were monitored continuously. The  Colonoscope was introduced through the anus and                         advanced to the the terminal ileum. The colonoscopy                         was performed without difficulty. The patient                         tolerated the procedure well. The quality of the bowel                         preparation was good. Findings:      The perianal and digital rectal examinations were normal.      The terminal ileum appeared normal.      A few small-mouthed diverticula were found in the sigmoid colon and       transverse colon.      Non-bleeding internal hemorrhoids were found during retroflexion. The       hemorrhoids were Grade I (internal  hemorrhoids that do not prolapse).      The exam was otherwise without abnormality on direct and retroflexion       views. Impression:            - The examined portion of the ileum was normal.                        - Diverticulosis in the sigmoid colon and in the                         transverse colon.                        - Non-bleeding internal hemorrhoids.                        - The examination was otherwise normal on direct and                         retroflexion views.                        - No specimens collected. Recommendation:        - Discharge patient to home.                        - Resume previous diet.                        - Continue present medications.                        - Repeat colonoscopy in 5 years for surveillance.                        - Return to referring physician as previously                         scheduled. Procedure Code(s):     --- Professional ---  G0105, Colorectal cancer screening; colonoscopy on                         individual at high risk Diagnosis Code(s):     --- Professional ---                        Z86.010, Personal history of colonic polyps                        K64.0, First degree hemorrhoids                        Z80.0, Family history of malignant neoplasm of                         digestive organs                        K57.30, Diverticulosis of large intestine without                         perforation or abscess without bleeding CPT copyright 2019 American Medical Association. All rights reserved. The codes documented in this report are preliminary and upon coder review may  be revised to meet current compliance requirements. Andrey Farmer, MD Andrey Farmer MD, MD 02/15/2020 10:14:30 AM Number of Addenda: 0 Note Initiated On: 02/15/2020 9:36 AM Scope Withdrawal Time: 0 hours 8 minutes 51 seconds  Total Procedure Duration: 0 hours 12 minutes 52 seconds  Estimated Blood Loss:   Estimated blood loss: none.      [facility]

## 2020-02-15 NOTE — Anesthesia Procedure Notes (Signed)
Date/Time: 02/15/2020 9:45 AM Performed by: Henrietta Hoover, CRNA Pre-anesthesia Checklist: Patient identified, Emergency Drugs available, Suction available, Patient being monitored and Timeout performed Patient Re-evaluated:Patient Re-evaluated prior to induction Oxygen Delivery Method: Nasal cannula Preoxygenation: Pre-oxygenation with 100% oxygen Placement Confirmation: positive ETCO2

## 2020-02-15 NOTE — Anesthesia Preprocedure Evaluation (Signed)
Anesthesia Evaluation  Patient identified by MRN, date of birth, ID band Patient awake    Reviewed: Allergy & Precautions, H&P , NPO status , Patient's Chart, lab work & pertinent test results  History of Anesthesia Complications Negative for: history of anesthetic complications  Airway Mallampati: III  TM Distance: <3 FB Neck ROM: full    Dental  (+) Chipped, Poor Dentition, Caps   Pulmonary neg shortness of breath, asthma ,    breath sounds clear to auscultation       Cardiovascular Exercise Tolerance: Good hypertension, (-) angina(-) Past MI and (-) DOE  Rhythm:Regular Rate:Normal - Systolic murmurs    Neuro/Psych negative neurological ROS  negative psych ROS   GI/Hepatic negative GI ROS, Neg liver ROS, neg GERD  ,  Endo/Other  Hypothyroidism   Renal/GU      Musculoskeletal   Abdominal   Peds  Hematology negative hematology ROS (+)   Anesthesia Other Findings Signs and symptoms suggestive of sleep apnea   Past Medical History: No date: Hypertension  Past Surgical History: No date: CERVICAL DISC SURGERY  BMI    Body Mass Index:  31.38 kg/m      Reproductive/Obstetrics negative OB ROS                             Anesthesia Physical  Anesthesia Plan  ASA: III  Anesthesia Plan: General   Post-op Pain Management:    Induction: Intravenous  PONV Risk Score and Plan: 2 and Ondansetron, TIVA and Propofol infusion  Airway Management Planned: Natural Airway  Additional Equipment: None  Intra-op Plan:   Post-operative Plan:   Informed Consent: I have reviewed the patients History and Physical, chart, labs and discussed the procedure including the risks, benefits and alternatives for the proposed anesthesia with the patient or authorized representative who has indicated his/her understanding and acceptance.     Dental Advisory Given  Plan Discussed with:  Anesthesiologist, CRNA and Surgeon  Anesthesia Plan Comments: (Discussed risks of anesthesia with patient, including possibility of difficulty with spontaneous ventilation under anesthesia necessitating airway intervention, PONV, and rare risks such as cardiac or respiratory or neurological events. Patient understands.)        Anesthesia Quick Evaluation

## 2020-02-15 NOTE — Transfer of Care (Signed)
Immediate Anesthesia Transfer of Care Note  Patient: Duane Soto  Procedure(s) Performed: COLONOSCOPY WITH PROPOFOL (N/A )  Patient Location: PACU  Anesthesia Type:General  Level of Consciousness: awake, alert  and oriented  Airway & Oxygen Therapy: Patient Spontanous Breathing and Patient connected to nasal cannula oxygen  Post-op Assessment: Report given to RN and Post -op Vital signs reviewed and stable  Post vital signs: Reviewed and stable  Last Vitals:  Vitals Value Taken Time  BP    Temp    Pulse 78 02/15/20 1026  Resp 16 02/15/20 1026  SpO2 94 % 02/15/20 1026  Vitals shown include unvalidated device data.  Last Pain:  Vitals:   02/15/20 1021  TempSrc: Temporal  PainSc: 1          Complications: No complications documented.

## 2020-02-15 NOTE — Anesthesia Postprocedure Evaluation (Signed)
Anesthesia Post Note  Patient: Duane Soto  Procedure(s) Performed: COLONOSCOPY WITH PROPOFOL (N/A )  Patient location during evaluation: Endoscopy Anesthesia Type: General Level of consciousness: awake and alert Pain management: pain level controlled Vital Signs Assessment: post-procedure vital signs reviewed and stable Respiratory status: spontaneous breathing, nonlabored ventilation, respiratory function stable and patient connected to nasal cannula oxygen Cardiovascular status: blood pressure returned to baseline and stable Postop Assessment: no apparent nausea or vomiting Anesthetic complications: no   No complications documented.   Last Vitals:  Vitals:   02/15/20 0927 02/15/20 1021  BP: (!) 154/114   Pulse: 80   Resp: 18   Temp: (!) 35.9 C (!) 36.1 C  SpO2: 98%     Last Pain:  Vitals:   02/15/20 1051  TempSrc:   PainSc: 0-No pain                 Corinda Gubler

## 2020-02-16 ENCOUNTER — Encounter: Payer: Self-pay | Admitting: Gastroenterology

## 2020-09-25 ENCOUNTER — Encounter: Payer: Self-pay | Admitting: Emergency Medicine

## 2020-09-25 ENCOUNTER — Other Ambulatory Visit: Payer: Self-pay

## 2020-09-25 ENCOUNTER — Ambulatory Visit
Admission: EM | Admit: 2020-09-25 | Discharge: 2020-09-25 | Disposition: A | Discharge status: Home / Self Care | Payer: Federal, State, Local not specified - PPO

## 2020-09-25 DIAGNOSIS — S0502XA Injury of conjunctiva and corneal abrasion without foreign body, left eye, initial encounter: Secondary | ICD-10-CM | POA: Diagnosis not present

## 2020-09-25 DIAGNOSIS — T1502XA Foreign body in cornea, left eye, initial encounter: Secondary | ICD-10-CM | POA: Diagnosis not present

## 2020-09-25 MED ORDER — CIPROFLOXACIN HCL 0.3 % OP SOLN: 1.0000 [drp] | OPHTHALMIC | 0 refills | Status: AC

## 2020-09-25 MED ORDER — KETOROLAC TROMETHAMINE 0.5 % OP SOLN: 1.0000 [drp] | Freq: Four times a day (QID) | OPHTHALMIC | 0 refills | Status: AC

## 2020-09-25 NOTE — ED Triage Notes (Signed)
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  Patient states that he may have gotten some material or burn both his eyes.  Patient has redness, drainage and pain in both eyes.  Left eye is worse than right eye.

## 2020-09-25 NOTE — Discharge Instructions (Addendum)
You still have a piece of the foreign body of your left cornea.  Please contact [facility] tomorrow.  They will likely need to use special tools and a headlamp to remove this.  For now I have sent over anti-inflammatory/pain drops and an antibiotic drop to try to prevent infection.  He can use cool compresses and wearing sun shades.  Tylenol and ibuprofen as needed for pain.  [facility] Address: [address], [place], [place] [address] Phone: [phone]

## 2020-09-25 NOTE — ED Provider Notes (Signed)
[facility]    CSN: [id number] Arrival date & time: 09/25/20  1358      History   Chief Complaint Chief Complaint  Patient presents with   Foreign Body in Eye    HPI Duane Soto is a 55 y.o. male presenting for foreign body sensation of the left eye.  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  He says that something flew into his eye and he swatted out.  He said that he then flushed his eye.  He has redness and watery drainage from both eyes and light sensitivity but states he only has a foreign body sensation of the left eye.  Patient says that he has had foreign bodies of his eye before and been able to remove them but admits to difficulty keeping his left eye open due to pain and sensitivity.  Denies any blurred or double vision.  Patient does not wear contacts.  He denies any discolored drainage from the eye.  He has taken ibuprofen but nothing else for pain relief.  He has no other complaints or concerns.  HPI  Past Medical History:  Diagnosis Date   Asthma    Hypertension    Hypothyroidism     Patient Active Problem List   Diagnosis Date Noted   Allergic rhinitis, seasonal 02/17/2015   Benign essential hypertension 02/17/2015   Low testosterone 01/25/2014   Cervical spondylosis 09/22/2012   Shoulder joint pain 09/22/2012    Past Surgical History:  Procedure Laterality Date   CERVICAL DISC SURGERY     COLONOSCOPY WITH PROPOFOL     x2   COLONOSCOPY WITH PROPOFOL N/A 02/15/2020   Procedure: COLONOSCOPY WITH PROPOFOL;  Surgeon: Lesly Rubenstein, MD;  Location: [facility] ENDOSCOPY;  Service: Endoscopy;  Laterality: N/A;   ORIF ANKLE FRACTURE Right 02/14/2017   Procedure: OPEN REDUCTION INTERNAL FIXATION (ORIF) ANKLE FRACTURE;  Surgeon: Hessie Knows, MD;  Location: [facility] ORS;  Service: Orthopedics;  Laterality: Right;       Home Medications    Prior to Admission medications   Medication  Sig Start Date End Date Taking? Authorizing Provider  ciprofloxacin (CILOXAN) 0.3 % ophthalmic solution Place 1 drop into both eyes every 4 (four) hours while awake for 7 days. 09/25/20 10/02/20 Yes Danton Clap, PA-C  doxycycline (VIBRAMYCIN) 100 MG capsule Take 100 mg by mouth 2 (two) times a week. May take 100 mg once daily for up to 7 days as needed for rosacea flares   Yes [provider]  hydrochlorothiazide (HYDRODIURIL) 50 MG tablet Take 50 mg by mouth daily. 09/02/20  Yes [provider]  ketorolac (ACULAR) 0.5 % ophthalmic solution Place 1 drop into both eyes every 6 (six) hours. 09/25/20  Yes Danton Clap, PA-C  levothyroxine (SYNTHROID, LEVOTHROID) 75 MCG tablet Take 75 mcg by mouth daily before breakfast.   Yes [provider]  loratadine (CLARITIN) 10 MG tablet Take 10 mg by mouth daily as needed for allergies.    Yes [provider]  metFORMIN (GLUCOPHAGE-XR) 500 MG 24 hr tablet SMARTSIG:1 Tablet(s) By Mouth Every Evening 09/02/20  Yes [provider]  montelukast (SINGULAIR) 10 MG tablet Take 10 mg by mouth at bedtime. 09/02/20  Yes [provider]  rosuvastatin (CRESTOR) 20 MG tablet Take by mouth. 09/02/20 09/02/21 Yes [provider]  sildenafil (REVATIO) 20 MG tablet 3-5 tabs PO daily prn prior to sex 09/02/20  Yes [provider]  Testosterone Cypionate 200 MG/ML KIT Inject 180 mg into the muscle every 14 (fourteen) days.    Yes [provider]  albuterol (PROVENTIL HFA;VENTOLIN HFA) 108 (90 Base) MCG/ACT inhaler Inhale 2 puffs into the lungs every 6 (six) hours as needed for wheezing or shortness of breath.    [provider]  fluticasone (FLONASE) 50 MCG/ACT nasal spray Place 2 sprays into both nostrils daily as needed for allergies or rhinitis.    [provider]  ibuprofen (ADVIL,MOTRIN) 200 MG tablet Take 800 mg by mouth every 8 (eight) hours as needed for headache or moderate pain.     [provider]  losartan-hydrochlorothiazide (HYZAAR) 100-25 MG tablet Take 1 tablet by mouth daily.  09/13/15   [provider]  ondansetron (ZOFRAN-ODT) 4 MG disintegrating tablet Take 1 tablet (4 mg total) by mouth every 8 (eight) hours as needed for nausea or vomiting. 02/12/17   Triplett, Cari B, FNP  oxyCODONE-acetaminophen (ROXICET) 5-325 MG tablet Take 1-2 tablets by mouth every 4 (four) hours as needed for severe pain. 02/12/17   Victorino Dike, FNP    Family History History reviewed. No pertinent family history.  Social History Social History   Tobacco Use   Smoking status: Never   Smokeless tobacco: Never  Vaping Use   Vaping Use: Never used  Substance Use Topics   Alcohol use: Yes    Comment: occ.   Drug use: No     Allergies   Patient has no known allergies.   Review of Systems Review of Systems  Constitutional:  Negative for fatigue and fever.  HENT:  Negative for facial swelling.   Eyes:  Positive for photophobia, pain, discharge, redness and visual disturbance.  Gastrointestinal:  Negative for nausea and vomiting.  Skin:  Negative for rash.  Neurological:  Negative for dizziness, weakness and headaches.  All other systems reviewed and are negative.   Physical Exam Triage Vital Signs ED Triage Vitals  Enc Vitals Group     BP 09/25/20 1430 132/88     Pulse Rate 09/25/20 1430 77     Resp 09/25/20 1430 16     Temp 09/25/20 1430 97.9 F (36.6 C)     Temp Source 09/25/20 1430 Oral     SpO2 09/25/20 1430 98 %     Weight 09/25/20 1427 224 lb (101.6 kg)     Height 09/25/20 1427 5' 11"  (1.803 m)     Head Circumference --      Peak Flow --      Pain Score 09/25/20 1427 8     Pain Loc --      Pain Edu? --      Excl. in [place]? --    No data found.  Updated Vital Signs BP 132/88 (BP Location: Left Arm)   Pulse 77   Temp 97.9 F (36.6 C) (Oral)   Resp 16   Ht 5' 11"  (1.803 m)   Wt 224 lb (101.6 kg)   SpO2 98%   BMI 31.24 kg/m    Visual Acuity Right Eye Distance: 20/30 uncorrected Left Eye Distance: 20/25 uncorrected Bilateral Distance: 20/20 uncorrected  Right Eye Near:   Left Eye Near:    Bilateral Near:     Physical Exam Vitals and nursing note reviewed.  Constitutional:      General: He is not in acute distress.    Appearance: Normal appearance. He is well-developed. He is not ill-appearing.  HENT:     Head:  Normocephalic and atraumatic.  Eyes:     General: No scleral icterus.       Right eye: Discharge (clear drainage) present.        Left eye: Foreign body and discharge (clear drainage) present.    Extraocular Movements: Extraocular movements intact.     Conjunctiva/sclera:     Right eye: Right conjunctiva is injected (mild).     Left eye: Left conjunctiva is injected (significant).     Pupils: Pupils are equal, round, and reactive to light.      Comments: There is a small metallic FB as shown in picture above (red) and corneal abrasions as noted in blue  Cardiovascular:     Rate and Rhythm: Normal rate and regular rhythm.  Pulmonary:     Effort: Pulmonary effort is normal. No respiratory distress.  Musculoskeletal:     Cervical back: Neck supple.  Skin:    General: Skin is warm and dry.  Neurological:     General: No focal deficit present.     Mental Status: He is alert. Mental status is at baseline.     Motor: No weakness.     Gait: Gait normal.  Psychiatric:        Mood and Affect: Mood normal.        Behavior: Behavior normal.        Thought Content: Thought content normal.     UC Treatments / Results  Labs (all labs ordered are listed, but only abnormal results are displayed) Labs Reviewed - No data to display  EKG   Radiology No results found.  Procedures Procedures (including critical care time)  FB removal: There is a small metallic foreign body of the left cornea.  I was able to partially remove it with a sterile cotton swab after instilling tetracaine.  Patient  had difficulty tolerating this though.  Other attempts to remove the remaining portion of the foreign body were unsuccessful.  It appears to be more embedded in the cornea.  Medications Ordered in UC Medications - No data to display  Initial Impression / Assessment and Plan / UC Course  I have reviewed the triage vital signs and the nursing notes.  Pertinent labs & imaging results that were available during my care of the patient were reviewed by me and considered in my medical decision making (see chart for details).  55 year old male presenting for foreign body left eye and bilateral eye pain, redness and tearing.  Also admits to significant photophobia.  Advised able to partially remove the foreign body from the left cornea but not completely.  Patient will need to follow-up with [facility].  Patient given contact information to contact them tomorrow.  At this time, I have sent over ketorolac eyedrops.  Also sent in Ciloxan to try to prevent infection since he has the abrasions and foreign body.  Supportive care encouraged with ibuprofen and Tylenol and wearing his sun shades.  ED precautions reviewed.   Final Clinical Impressions(s) / UC Diagnoses   Final diagnoses:  Foreign body of left cornea, initial encounter  Abrasion of left cornea, initial encounter     Discharge Instructions      He still have a piece of the foreign body of your left cornea.  Please contact [facility] tomorrow.  They will likely need to use special tools and a headlamp to remove this.  For now I have sent over anti-inflammatory/pain drops and an antibiotic drop to try to prevent infection.  He can use cool compresses and wearing sun shades.  Tylenol and ibuprofen as needed for pain.  [facility] [place] [facility] Address: [address] [place], [place] [address] Phone: [phone]     ED Prescriptions     Medication Sig Dispense Auth. Provider   ketorolac (ACULAR) 0.5 % ophthalmic solution  Place 1 drop into both eyes every 6 (six) hours. 5 mL Laurene Footman B, PA-C   ciprofloxacin (CILOXAN) 0.3 % ophthalmic solution Place 1 drop into both eyes every 4 (four) hours while awake for 7 days. 5 mL Danton Clap, PA-C      PDMP not reviewed this encounter.   Danton Clap, PA-C 09/25/20 1552
